# Patient Record
Sex: Male | Born: 1995
Health system: Southern US, Community
[De-identification: ages and names within clinical notes are randomized; demographics above are authoritative.]

## PROBLEM LIST (undated history)

## (undated) DIAGNOSIS — J45909 Unspecified asthma, uncomplicated: Secondary | ICD-10-CM

## (undated) HISTORY — PX: ANTERIOR CRUCIATE LIGAMENT REPAIR: SHX115

## (undated) HISTORY — PX: MENISCUS REPAIR: SHX5179

## (undated) HISTORY — DX: Unspecified asthma, uncomplicated: J45.909

---

## 2017-04-24 ENCOUNTER — Encounter: Payer: Self-pay | Admitting: Family Medicine

## 2017-04-24 ENCOUNTER — Ambulatory Visit (INDEPENDENT_AMBULATORY_CARE_PROVIDER_SITE_OTHER): Payer: BLUE CROSS/BLUE SHIELD | Admitting: Family Medicine

## 2017-04-24 VITALS — BP 124/67 | HR 77 | Temp 98.7°F | Resp 14

## 2017-04-24 DIAGNOSIS — J069 Acute upper respiratory infection, unspecified: Secondary | ICD-10-CM

## 2017-04-24 MED ORDER — AZITHROMYCIN 250 MG PO TABS: ORAL_TABLET | ORAL | 0 refills | Status: DC

## 2017-04-24 NOTE — Progress Notes (Signed)
Patient presents with nasal congestion and cough for one week. Admits to worsening night cough and colored mucus. Has started to take Claritin. Denies hx of asthma or sinus problems. Denies CP, SOB, fever, severe headache, N/V/D.  ROS: Negative except mentioned above. Vitals as per Epic. GENERAL: NAD HEENT: mild pharyngeal erythema, no exudate, no erythema of TMs, no cervical LAD RESP: CTA B CARD: RRR NEURO: CN II-XII grossly intact   A/P: URI - Z-pk, Nyquil at night for a few nights, continue Claritin, rest, hydration, seek medical attention if symptoms persist or worsen. No athletic activity or class if febrile.

## 2017-07-09 ENCOUNTER — Other Ambulatory Visit: Payer: Self-pay | Admitting: Family Medicine

## 2017-07-09 DIAGNOSIS — R05 Cough: Secondary | ICD-10-CM

## 2017-07-09 DIAGNOSIS — R059 Cough, unspecified: Secondary | ICD-10-CM

## 2017-07-10 ENCOUNTER — Ambulatory Visit
Admission: RE | Admit: 2017-07-10 | Discharge: 2017-07-10 | Disposition: A | Discharge status: Home / Self Care | Payer: BLUE CROSS/BLUE SHIELD | Source: Ambulatory Visit | Attending: Family Medicine | Admitting: Family Medicine

## 2017-07-10 DIAGNOSIS — R05 Cough: Secondary | ICD-10-CM | POA: Diagnosis present

## 2017-07-10 DIAGNOSIS — R059 Cough, unspecified: Secondary | ICD-10-CM

## 2017-07-16 ENCOUNTER — Encounter: Payer: Self-pay | Admitting: Family Medicine

## 2017-07-16 ENCOUNTER — Ambulatory Visit (INDEPENDENT_AMBULATORY_CARE_PROVIDER_SITE_OTHER): Payer: PRIVATE HEALTH INSURANCE | Admitting: Family Medicine

## 2017-07-16 VITALS — BP 136/54 | HR 65 | Temp 97.3°F | Resp 14

## 2017-07-16 DIAGNOSIS — R059 Cough, unspecified: Secondary | ICD-10-CM

## 2017-07-16 DIAGNOSIS — R05 Cough: Secondary | ICD-10-CM

## 2017-07-16 NOTE — Progress Notes (Signed)
Patient presents today with symptoms of persistent cough. Patient states that he has had a cough since November. He denies any known allergens. He denies any history of asthma. He states that it is a mucousy cough and usually is worse in the mornings and at night. He occasionally coughs at night and does take a cough suppressant. He was treated with a Z-Pak back in November when I saw him. His cough persisted so he went to a doctor at home who gave him another Z-Pak prescription along with an Albuterol Inhaler, Flonase and cough medication. When he returned back to school his cough was still persistent so a chest x-ray was ordered which was read as normal. Patient denies any fever, chills or weight loss. He denies any chest pain, shortness of breath, wheezing. His symptoms are not affecting him playing baseball.  ROS: Negative except mentioned above.  GENERAL: NAD HEENT: mild pharyngeal erythema, no exudate, no erythema of TMs, no cervical LAD RESP: CTA B CARD: RRR NEURO: CN II-XII grossly intact   A/P: Persistent cough - appears to be more allergy related, advised patient to take Flonase daily and also an oral antihistamine such as Claritin or Zyrtec. I have advised him to do this for 7-10 days to see if it makes a difference with his symptoms. I have also ordered a CBC and a BMP. If symptoms do persist would recommend pulmonary referral and/or allergy testing. Patient addresses understanding and will seek medical attention if any acute problems.

## 2017-07-17 LAB — CBC WITH DIFFERENTIAL/PLATELET
Basophils Absolute: 0 10*3/uL (ref 0.0–0.2)
Basos: 0 %
EOS (ABSOLUTE): 0.1 10*3/uL (ref 0.0–0.4)
Eos: 1 %
Hematocrit: 47.3 % (ref 37.5–51.0)
Hemoglobin: 16 g/dL (ref 13.0–17.7)
Immature Grans (Abs): 0 10*3/uL (ref 0.0–0.1)
Immature Granulocytes: 0 %
Lymphocytes Absolute: 1.4 10*3/uL (ref 0.7–3.1)
Lymphs: 17 %
MCH: 29.5 pg (ref 26.6–33.0)
MCHC: 33.8 g/dL (ref 31.5–35.7)
MCV: 87 fL (ref 79–97)
Monocytes Absolute: 0.4 10*3/uL (ref 0.1–0.9)
Monocytes: 5 %
Neutrophils Absolute: 6.2 10*3/uL (ref 1.4–7.0)
Neutrophils: 77 %
Platelets: 267 10*3/uL (ref 150–379)
RBC: 5.42 x10E6/uL (ref 4.14–5.80)
RDW: 13.8 % (ref 12.3–15.4)
WBC: 8 10*3/uL (ref 3.4–10.8)

## 2017-07-17 LAB — BASIC METABOLIC PANEL
BUN/Creatinine Ratio: 11 (ref 9–20)
BUN: 14 mg/dL (ref 6–20)
CO2: 22 mmol/L (ref 20–29)
Calcium: 9.7 mg/dL (ref 8.7–10.2)
Chloride: 103 mmol/L (ref 96–106)
Creatinine, Ser: 1.23 mg/dL (ref 0.76–1.27)
GFR calc Af Amer: 97 mL/min/{1.73_m2} (ref >59)
GFR calc non Af Amer: 84 mL/min/{1.73_m2} (ref >59)
Glucose: 91 mg/dL (ref 65–99)
Potassium: 4.7 mmol/L (ref 3.5–5.2)
Sodium: 141 mmol/L (ref 134–144)

## 2017-07-30 ENCOUNTER — Other Ambulatory Visit: Payer: Self-pay | Admitting: Family Medicine

## 2017-07-30 ENCOUNTER — Institutional Professional Consult (permissible substitution): Payer: PRIVATE HEALTH INSURANCE | Admitting: Internal Medicine

## 2017-07-30 DIAGNOSIS — R059 Cough, unspecified: Secondary | ICD-10-CM

## 2017-07-30 DIAGNOSIS — R05 Cough: Secondary | ICD-10-CM

## 2017-07-31 ENCOUNTER — Institutional Professional Consult (permissible substitution): Payer: PRIVATE HEALTH INSURANCE | Admitting: Internal Medicine

## 2017-07-31 ENCOUNTER — Other Ambulatory Visit: Payer: Self-pay | Admitting: Family Medicine

## 2017-07-31 DIAGNOSIS — R05 Cough: Secondary | ICD-10-CM

## 2017-07-31 DIAGNOSIS — R059 Cough, unspecified: Secondary | ICD-10-CM

## 2017-08-11 ENCOUNTER — Ambulatory Visit (INDEPENDENT_AMBULATORY_CARE_PROVIDER_SITE_OTHER): Payer: BLUE CROSS/BLUE SHIELD | Admitting: Internal Medicine

## 2017-08-11 ENCOUNTER — Encounter: Payer: Self-pay | Admitting: Internal Medicine

## 2017-08-11 VITALS — BP 136/86 | HR 58 | Ht 69.0 in | Wt 173.0 lb

## 2017-08-11 DIAGNOSIS — J40 Bronchitis, not specified as acute or chronic: Secondary | ICD-10-CM

## 2017-08-11 MED ORDER — FLUTICASONE PROPIONATE HFA 220 MCG/ACT IN AERO: 2.0000 | INHALATION_SPRAY | Freq: Two times a day (BID) | RESPIRATORY_TRACT | 2 refills | Status: AC

## 2017-08-11 NOTE — Progress Notes (Signed)
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 chronic cough His cough started 4 months ago after he had a upper respiratory tract infection and since then and intermittently he has been having chronic productive cough with green tinged sputum along with chest congestion Patient also noted to have postnasal drainage Patient has been prescribed Flonase and antihistamine with Allegra he says that it has helped a little Patient denies wheezing but does have intermittent cough with intermittent exercise But overall does not really affect his exercise Patient is a baseball player outfielder Patient was given 2 rounds of antibiotics with Z-Pak and did not help his  chronic productive cough Patient was also given albuterol inhaler as needed and it does not seem to help as much Patient denies any fevers or chills at this time No signs of infection at this time  PAST MEDICAL HISTORY :  ALLERGHIC RHINITIS  SURGICAL HISTORY  Patient has no prior history of surgeries No trauma Denies surgical procedures   Prior to Admission medications   Medication Sig Start Date End Date Taking? Authorizing Provider  azithromycin (ZITHROMAX Z-PAK) 250 MG tablet Use as directed on box for 5 days. Patient not taking: Reported on 07/16/2017 04/24/17   Jolene Provost, MD  loratadine (CLARITIN) 10 MG tablet Take 10 mg by mouth daily.    [provider]   No Known Allergies  FAMILY HISTORY:  +HTN  SOCIAL HISTORY:  reports that  has never smoked. he has never used smokeless tobacco. He reports that he does not drink alcohol.  REVIEW OF SYSTEMS:     Constitutional: Negative for fever, chills, weight loss, malaise/fatigue and diaphoresis.  HENT: Negative for hearing loss, ear pain, nosebleeds, congestion, sore throat, neck pain, tinnitus and ear discharge.   Eyes: Negative for blurred vision, double vision, photophobia, pain, discharge and redness.  Respiratory: + cough,- hemoptysis, +sputum production, -shortness of breath, -wheezing and -stridor.   Cardiovascular: Negative for chest pain, palpitations, orthopnea, claudication, leg swelling and PND.  Gastrointestinal: Negative for heartburn, nausea, vomiting, abdominal pain, diarrhea, constipation, blood in stool and melena.  Genitourinary: Negative for dysuria, urgency, frequency, hematuria and flank pain.  Musculoskeletal: Negative for myalgias, back pain, joint pain and falls.  Skin: Negative for itching and rash.  Neurological: Negative for dizziness, tingling, tremors, sensory change, speech change, focal weakness, seizures, loss of consciousness, weakness and headaches.  Endo/Heme/Allergies: Negative for environmental allergies and polydipsia. Does not bruise/bleed easily.  ALL OTHER ROS ARE NEGATIVE BP 136/86 (BP Location: Left Arm, Cuff Size: Normal)   Pulse (!) 58   Ht 5\' 9"  (1.753 m)   Wt 173 lb (78.5 kg)   SpO2 99%   BMI 25.55 kg/m    Physical Examination:   GENERAL:NAD, no fevers, chills, no weakness no fatigue HEAD: Normocephalic, atraumatic.  EYES: Pupils equal, round, reactive to light. Extraocular muscles intact. No scleral icterus.  MOUTH: Moist mucosal membrane.   EAR, NOSE, THROAT: Clear without exudates. No external lesions.  NECK: Supple. No thyromegaly. No nodules. No JVD.  PULMONARY:CTA B/L no wheezes, no crackles, no rhonchi CARDIOVASCULAR: S1 and S2. Regular rate and rhythm. No murmurs, rubs, or  gallops. No edema.  GASTROINTESTINAL: Soft, nontender, nondistended. No masses. Positive bowel sounds.  MUSCULOSKELETAL: No swelling, clubbing, or edema. Range  of motion full in all extremities.  NEUROLOGIC: Cranial nerves II through XII are intact. No gross focal neurological deficits.  SKIN: No ulceration, lesions, rashes, or cyanosis. Skin warm and dry. Turgor intact.  PSYCHIATRIC: Mood, affect within normal limits. The patient is awake, alert and oriented x 3. Insight, judgment intact.      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  At this time patient may have inflammatory bronchitis such as eosinophilic bronchitis  I would recommend treating with inhaled steroids with Flovent to 20 mcg 2 puffs twice daily We will reassess in 4 weeks to assess his chronic productive cough.  Patient  satisfied with Plan of action and management. All questions answered  Lucie LeatherKurian David Alyjah Lovingood, M.D.  [facility]  Medical Director [facility] Medical Director [facility]

## 2017-08-11 NOTE — Patient Instructions (Signed)
START FLOVENT

## 2017-08-12 ENCOUNTER — Telehealth: Payer: Self-pay | Admitting: Internal Medicine

## 2017-08-12 NOTE — Telephone Encounter (Signed)
Lance CockayneLinda Archer (pt mother) calling stating pt was prescribed Flovent  It is costing them $298 Would like to know if there is something else he can take of this   Please call back

## 2017-08-13 ENCOUNTER — Telehealth: Payer: Self-pay | Admitting: Internal Medicine

## 2017-08-13 MED ORDER — MOMETASONE FUROATE 220 MCG/INH IN AEPB: 1.0000 | INHALATION_SPRAY | Freq: Two times a day (BID) | RESPIRATORY_TRACT | 2 refills | Status: AC

## 2017-08-13 NOTE — Telephone Encounter (Signed)
Please have patient insurance company and obtain copy of your medication formulary   This will help your Pulmonologist to prescribe the most cost effective medications that your insurance company allows.

## 2017-08-13 NOTE — Telephone Encounter (Signed)
Spoke with patient's mother and advised as stated below. She will contact her insurance and get back with our office. Nothing further needed.

## 2017-08-13 NOTE — Telephone Encounter (Signed)
Spoke with Dr. Lionel DecemberKasa Asmanex has been sent to pharmacy. We will await call back to see if able to afford.

## 2017-08-13 NOTE — Telephone Encounter (Signed)
Patients mother aware  

## 2017-08-13 NOTE — Telephone Encounter (Signed)
Pt mother is calling states for Flovent is Tier 4. Tier 1 is coverered at 100%. 1-3 is 20% out of pocket.

## 2017-08-13 NOTE — Telephone Encounter (Signed)
Patient's mother will call back with medication on Tier 1 list.

## 2017-09-07 ENCOUNTER — Ambulatory Visit
Admission: RE | Admit: 2017-09-07 | Discharge: 2017-09-07 | Disposition: A | Discharge status: Home / Self Care | Payer: BLUE CROSS/BLUE SHIELD | Source: Ambulatory Visit | Attending: Family Medicine | Admitting: Family Medicine

## 2017-09-07 ENCOUNTER — Other Ambulatory Visit: Payer: Self-pay | Admitting: Family Medicine

## 2017-09-07 DIAGNOSIS — R52 Pain, unspecified: Secondary | ICD-10-CM

## 2017-09-07 DIAGNOSIS — R0781 Pleurodynia: Secondary | ICD-10-CM | POA: Diagnosis not present

## 2017-09-08 ENCOUNTER — Encounter: Payer: Self-pay | Admitting: Internal Medicine

## 2017-09-08 ENCOUNTER — Other Ambulatory Visit
Admission: RE | Admit: 2017-09-08 | Discharge: 2017-09-08 | Disposition: A | Discharge status: Home / Self Care | Payer: BLUE CROSS/BLUE SHIELD | Source: Ambulatory Visit | Attending: Internal Medicine | Admitting: Internal Medicine

## 2017-09-08 ENCOUNTER — Ambulatory Visit (INDEPENDENT_AMBULATORY_CARE_PROVIDER_SITE_OTHER): Payer: BLUE CROSS/BLUE SHIELD | Admitting: Internal Medicine

## 2017-09-08 VITALS — BP 126/80 | HR 60 | Ht 69.0 in | Wt 175.0 lb

## 2017-09-08 DIAGNOSIS — J4520 Mild intermittent asthma, uncomplicated: Secondary | ICD-10-CM | POA: Diagnosis not present

## 2017-09-08 DIAGNOSIS — R0602 Shortness of breath: Secondary | ICD-10-CM

## 2017-09-08 NOTE — Patient Instructions (Addendum)
AVOID EXPOSURE TO MOLD STOP INHALERS AT THIS TIME  FUNGAL CULTURES FUNGAL SEROLOGY SPUTUM CULTURE  Obtain CT CHEST

## 2017-09-08 NOTE — Progress Notes (Signed)
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  having chronic productive cough with green tinged sputum along with chest congestion I started Flovent/Asthmanex and his symptoms still persisit Patient also noted to have postnasal drainage Patient has been prescribed Flonase and antihistamine with Allegra he says that it has helped a little Patient denies wheezing But overall his symptoms do NOT really affect his exercise Patient is a baseball player outfielder  Patient denies any fevers or chills at this time No signs of infection at this time   Mom at OV today-she has shared her pics with me about his living conditions-there is MOLD in his APT that he is exposed to  PAST MEDICAL HISTORY :  ALLERGHIC RHINITIS  SURGICAL HISTORY  Patient has no prior history of surgeries No trauma Denies surgical procedures  REVIEW OF SYSTEMS:   Constitutional: Negative for fever, chills, weight loss, malaise/fatigue and diaphoresis.  HENT: Negative for hearing loss, ear pain, nosebleeds, congestion, sore throat, neck pain, tinnitus and ear discharge.   Eyes: Negative for blurred vision, double vision, photophobia, pain, discharge and redness.  Respiratory: + cough,- hemoptysis, +sputum production, -shortness of breath, -wheezing and -stridor.   Cardiovascular: Negative for chest pain, palpitations, orthopnea, claudication, leg swelling and PND.  ALL OTHER ROS ARE NEGATIVE  BP 126/80 (BP Location: Left Arm, Cuff Size: Normal)   Pulse 60   Ht 5\' 9"  (1.753 m)   Wt 175 lb (79.4 kg)   SpO2 98%   BMI 25.84 kg/m    Physical Examination:    GENERAL:NAD, no fevers, chills, no weakness no fatigue HEAD: Normocephalic, atraumatic.  EYES: Pupils equal, round, reactive to light. Extraocular muscles intact. No scleral icterus.  MOUTH: Moist mucosal membrane.   EAR, NOSE, THROAT: Clear without exudates. No external lesions.  NECK: Supple. No thyromegaly. No nodules. No JVD.  PULMONARY:CTA B/L no wheezes, no crackles, no rhonchi CARDIOVASCULAR: S1 and S2. Regular rate and rhythm. No murmurs, rubs, or gallops. No edema.  GASTROINTESTINAL: Soft, nontender, nondistended. No masses. Positive bowel sounds.  MUSCULOSKELETAL: No swelling, clubbing, or edema. Range of motion full in all extremities.  NEUROLOGIC: Cranial nerves II through XII are intact. No gross focal neurological deficits.  SKIN: No ulceration, lesions, rashes, or cyanosis. Skin warm and dry. Turgor intact.  PSYCHIATRIC: Mood, affect within normal limits. The patient is awake, alert and oriented x 3. Insight, judgment intact.      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  At this time patient may have reactive airways disease from MOLD exposure  I recommend checking Fungal and SPutum cultures Check Fungal serology Check CT chest for adenopathy/pneumonia  Stop inhalers at this time as they do NOT seem to be helping I have advised patient to move out of APT ASAP  Will follow up serology and CT chest and come up with plan of action once test results are completed   Patient/Family satisfied with Plan of  action and management. All questions answered  Lucie LeatherKurian David Kincade Granberg, M.D.  [facility]  Medical Director [facility] Medical Director [facility]

## 2017-09-11 LAB — FUNGAL ANTIBODIES PANEL, ID-BLOOD
Aspergillus flavus: NEGATIVE
Aspergillus fumigatus, IgG: NEGATIVE
Aspergillus niger: NEGATIVE
Blastomyces Abs, Qn, DID: NEGATIVE
Histoplasma Ab, Immunodiffusion: NEGATIVE

## 2017-09-15 ENCOUNTER — Other Ambulatory Visit
Admission: RE | Admit: 2017-09-15 | Discharge: 2017-09-15 | Disposition: A | Discharge status: Home / Self Care | Payer: BLUE CROSS/BLUE SHIELD | Source: Ambulatory Visit | Attending: Internal Medicine | Admitting: Internal Medicine

## 2017-09-15 DIAGNOSIS — J4520 Mild intermittent asthma, uncomplicated: Secondary | ICD-10-CM | POA: Insufficient documentation

## 2017-09-15 LAB — EXPECTORATED SPUTUM ASSESSMENT W GRAM STAIN, RFLX TO RESP C

## 2017-09-16 ENCOUNTER — Telehealth: Payer: Self-pay | Admitting: Internal Medicine

## 2017-09-16 NOTE — Telephone Encounter (Signed)
Patient family member calling to check on status of lab results Please call to discuss

## 2017-09-17 ENCOUNTER — Ambulatory Visit
Admission: RE | Admit: 2017-09-17 | Discharge: 2017-09-17 | Disposition: A | Discharge status: Home / Self Care | Payer: BLUE CROSS/BLUE SHIELD | Source: Ambulatory Visit | Attending: Internal Medicine | Admitting: Internal Medicine

## 2017-09-17 DIAGNOSIS — R0602 Shortness of breath: Secondary | ICD-10-CM | POA: Insufficient documentation

## 2017-09-17 MED ORDER — IOPAMIDOL (ISOVUE-300) INJECTION 61%
75.0000 mL | Freq: Once | INTRAVENOUS | Status: AC | PRN
  Administered 2017-09-17: 75 mL via INTRAVENOUS

## 2017-09-17 NOTE — Telephone Encounter (Signed)
I Called and Left VM for patient  All results of negative up to this point

## 2017-09-18 LAB — CULTURE, RESPIRATORY W GRAM STAIN: Culture: NORMAL

## 2017-09-18 NOTE — Telephone Encounter (Signed)
Called patient, unable to reach left message to give us a call back regarding results.  

## 2017-09-18 NOTE — Telephone Encounter (Signed)
Patient's mother aware all results negative per Dr. Belia HemanKasa.

## 2017-09-18 NOTE — Telephone Encounter (Signed)
Patient mother Lance Archer calling  Would like to know status of CAT Scan results Patient will be unavailable after 1pm Please call to discuss

## 2017-09-21 ENCOUNTER — Telehealth: Payer: Self-pay | Admitting: Internal Medicine

## 2017-09-21 NOTE — Telephone Encounter (Signed)
Pt aware all results normal Ct and labs. He will keep f/u scheduled for 09/29/17.

## 2017-09-28 ENCOUNTER — Ambulatory Visit (INDEPENDENT_AMBULATORY_CARE_PROVIDER_SITE_OTHER): Payer: BLUE CROSS/BLUE SHIELD | Admitting: Internal Medicine

## 2017-09-28 ENCOUNTER — Encounter: Payer: Self-pay | Admitting: Internal Medicine

## 2017-09-28 VITALS — BP 122/74 | HR 62 | Resp 16 | Ht 69.0 in | Wt 171.0 lb

## 2017-09-28 DIAGNOSIS — S83519A Sprain of anterior cruciate ligament of unspecified knee, initial encounter: Secondary | ICD-10-CM | POA: Insufficient documentation

## 2017-09-28 DIAGNOSIS — J309 Allergic rhinitis, unspecified: Secondary | ICD-10-CM | POA: Diagnosis not present

## 2017-09-28 DIAGNOSIS — M25569 Pain in unspecified knee: Secondary | ICD-10-CM | POA: Insufficient documentation

## 2017-09-28 NOTE — Progress Notes (Signed)
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 His cough is better, still productive in AM, but does NOT bother him as much at night Patient has signs and symptoms of allergic rhinitis  Patient also noted to have postnasal drainage  Patient denies wheezing But overall his symptoms do NOT really affect his exercise Patient is a baseball player outfielder  Patient denies any fevers or chills at this time No signs of infection at this time    PAST MEDICAL HISTORY :  ALLERGHIC RHINITIS  SURGICAL HISTORY  Patient has no prior history of surgeries No trauma Denies surgical procedures  REVIEW OF SYSTEMS:   Constitutional: Negative for fever, chills, weight loss, malaise/fatigue and diaphoresis.  HENT: Negative for hearing loss, ear pain, nosebleeds, congestion, sore throat, neck pain, tinnitus and ear discharge.   Eyes: Negative for blurred vision, double vision, photophobia, pain, discharge and redness.  Respiratory: + cough,- hemoptysis, +sputum production, -shortness of breath, -wheezing and -stridor.   Cardiovascular: Negative for chest pain, palpitations, orthopnea, claudication, leg swelling and PND.  ALL OTHER ROS ARE NEGATIVE  BP 122/74 (BP Location: Left Arm, Cuff Size: Normal)   Pulse 62   Resp 16   Ht 5\' 9"  (1.753 m)   Wt 171 lb (77.6 kg)   SpO2 98%   BMI 25.25 kg/m    Physical Examination:   GENERAL:NAD, no fevers, chills, no weakness no fatigue HEAD: Normocephalic, atraumatic.  EYES: Pupils equal, round, reactive to  light. Extraocular muscles intact. No scleral icterus.  MOUTH: Moist mucosal membrane.   EAR, NOSE, THROAT: Clear without exudates. No external lesions.  NECK: Supple. No thyromegaly. No nodules. No JVD.  PULMONARY:CTA B/L no wheezes, no crackles, no rhonchi CARDIOVASCULAR: S1 and S2. Regular rate and rhythm. No murmurs, rubs, or gallops. No edema.  GASTROINTESTINAL: Soft, nontender, nondistended. No masses. Positive bowel sounds.  MUSCULOSKELETAL: No swelling, clubbing, or edema. Range of motion full in all extremities.  NEUROLOGIC: Cranial nerves II through XII are intact. No gross focal neurological deficits.  SKIN: No ulceration, lesions, rashes, or cyanosis. Skin warm and dry. Turgor intact.  PSYCHIATRIC: Mood, affect within normal limits. The patient is awake, alert and oriented x 3. Insight, judgment intact.       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  At this time patient may have reactive airways disease from MOLD exposure His symptoms have improved [place] [facility] [place] [facility] [place] [facility] [place], patient has signs and symptoms of allergic rhinitis  ALL serologies and cultures are not growing anything significant  Stop inhalers at this time as they do NOT seem to be helping Patient has been instructed to take Flonase and Claritan daily    Patient/Family satisfied with Plan of action and management. All questions answered Mom was updated over the phone   Lucie LeatherKurian David Layla Gramm, M.D.  [facility]  Medical Director [place] Director [facility] Cardio-Pulmonary Department

## 2017-09-28 NOTE — Patient Instructions (Signed)
START FLONASE AND CLARITAN DAILY

## 2017-09-29 ENCOUNTER — Ambulatory Visit: Payer: PRIVATE HEALTH INSURANCE | Admitting: Internal Medicine

## 2017-10-06 LAB — CULTURE, FUNGUS WITHOUT SMEAR

## 2018-03-12 ENCOUNTER — Ambulatory Visit (INDEPENDENT_AMBULATORY_CARE_PROVIDER_SITE_OTHER): Payer: PRIVATE HEALTH INSURANCE | Admitting: Family Medicine

## 2018-03-12 ENCOUNTER — Ambulatory Visit
Admission: RE | Admit: 2018-03-12 | Discharge: 2018-03-12 | Disposition: A | Discharge status: Home / Self Care | Payer: BLUE CROSS/BLUE SHIELD | Source: Ambulatory Visit | Attending: Family Medicine | Admitting: Family Medicine

## 2018-03-12 ENCOUNTER — Encounter: Payer: Self-pay | Admitting: Family Medicine

## 2018-03-12 ENCOUNTER — Other Ambulatory Visit: Payer: Self-pay | Admitting: Family Medicine

## 2018-03-12 VITALS — BP 138/41 | HR 62 | Temp 98.3°F | Resp 13

## 2018-03-12 DIAGNOSIS — J069 Acute upper respiratory infection, unspecified: Secondary | ICD-10-CM

## 2018-03-12 DIAGNOSIS — R52 Pain, unspecified: Secondary | ICD-10-CM

## 2018-03-12 DIAGNOSIS — M79641 Pain in right hand: Secondary | ICD-10-CM | POA: Diagnosis not present

## 2018-03-12 DIAGNOSIS — M25541 Pain in joints of right hand: Secondary | ICD-10-CM | POA: Diagnosis present

## 2018-03-12 NOTE — Progress Notes (Signed)
Patient presents with symptoms of mild cough for the last two days. Denies any SOB, CP, wheezing, fever, severe headache, N/V/D, abdominal pain. Admits to some postnasal drip but no sore throat. Has taken Nyquil at night.   ROS: Negative except mentioned above. Vitals as per Epic.  GENERAL: NAD HEENT: mild  pharyngeal erythema, no exudate, no erythema of TMs, no cervical LAD RESP: CTA B CARD: RRR NEURO: CN II-XII grossly intact   A/P: URI - likely viral, rest, hydration, oral antihistamine for postnasal drip, Delysm for cough, Tylenol/Motrin prn, seek medical attention if symptoms persist or worsen, no athletic activity if febrile.

## 2019-09-25 IMAGING — CT CT CHEST W/ CM
2 of 4 series · 15 of 36 positions shown, 18 images · IV contrast (iopamidol)
Comparison: 09/07/2017 rib films.  07/10/2017 chest radiograph.

CLINICAL DATA: Shortness of breath. Adenopathy. Morning cough since
[REDACTED]. Nonsmoker without history of primary malignancy.

EXAM:
CT CHEST WITH CONTRAST
TECHNIQUE: Multidetector CT imaging of the chest was performed during
intravenous contrast administration.
CONTRAST:  75mL Y2BRVP-SJJ IOPAMIDOL (Y2BRVP-SJJ) INJECTION 61%

[Series 2: axial chest · axial · 0.69mm/px · z∈[-1335,-1037]mm · 12 of 177 slices shown, 15 images]
[im 14/177  mediastinal]
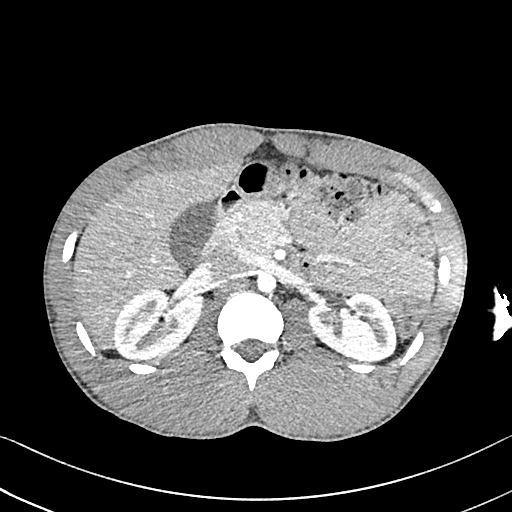
[im 14/177  lung]
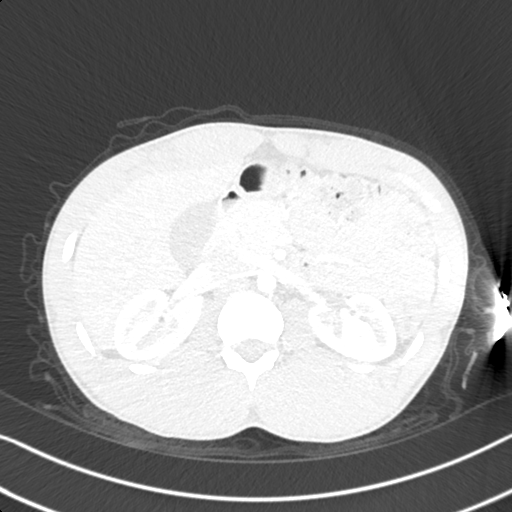
[im 28/177  lung]
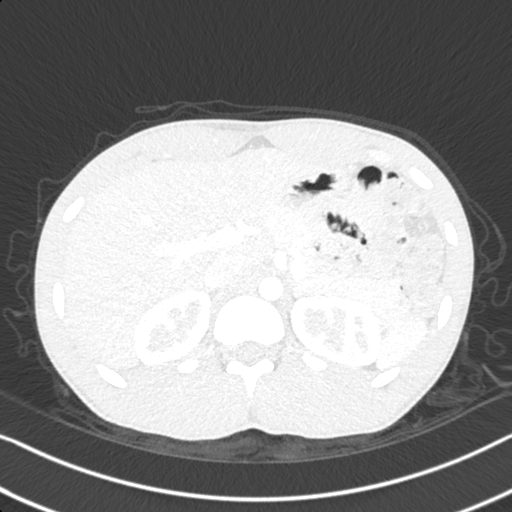
[im 41/177  lung]
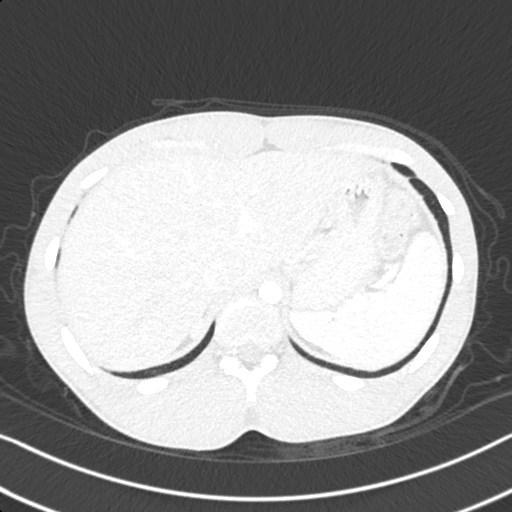
[im 55/177  lung]
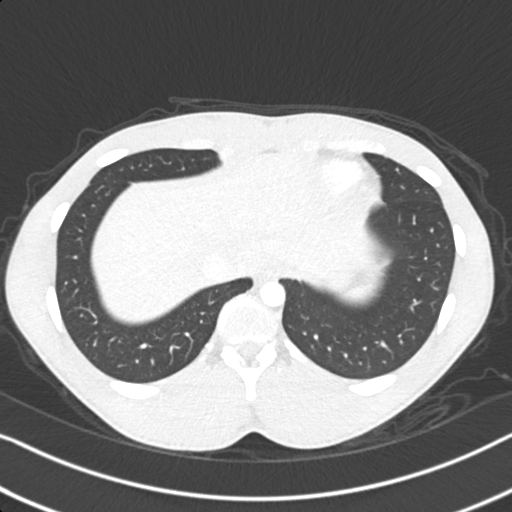
[im 68/177  mediastinal]
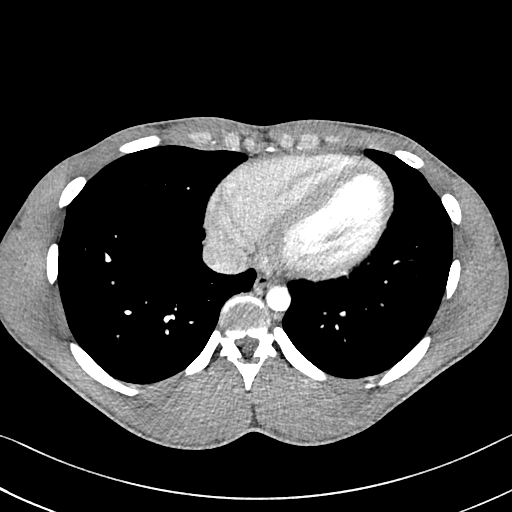
[im 68/177  lung]
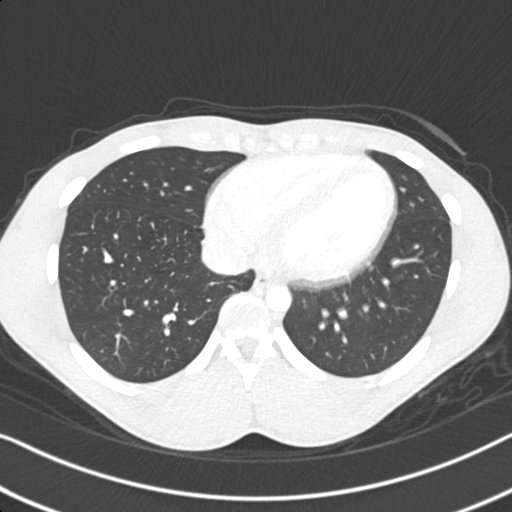
[im 82/177  lung]
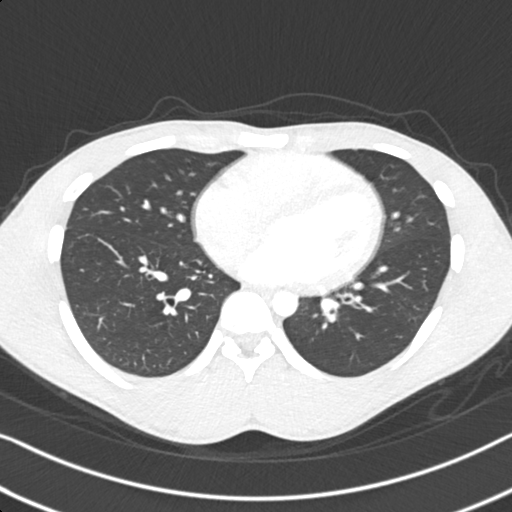
[im 95/177  lung]
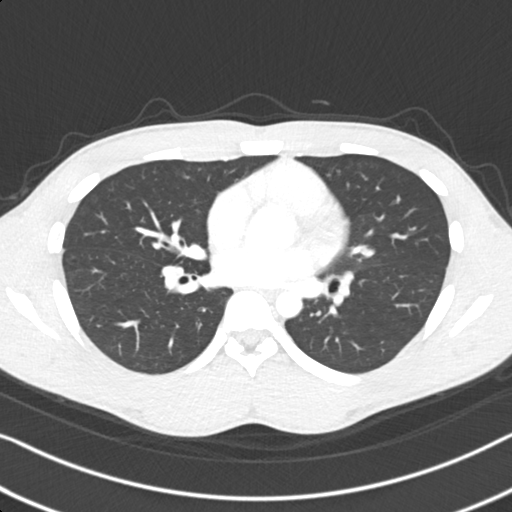
[im 109/177  lung]
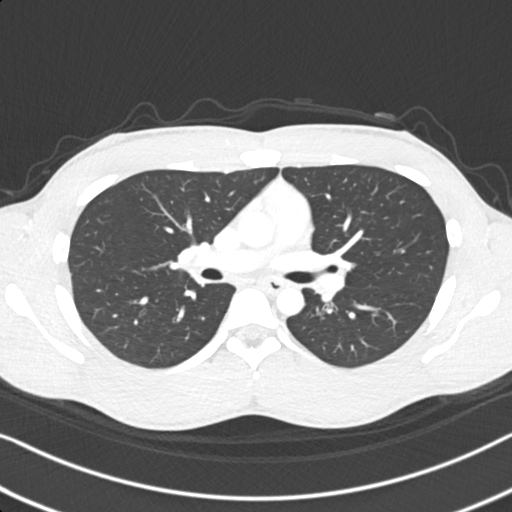
[im 122/177  mediastinal]
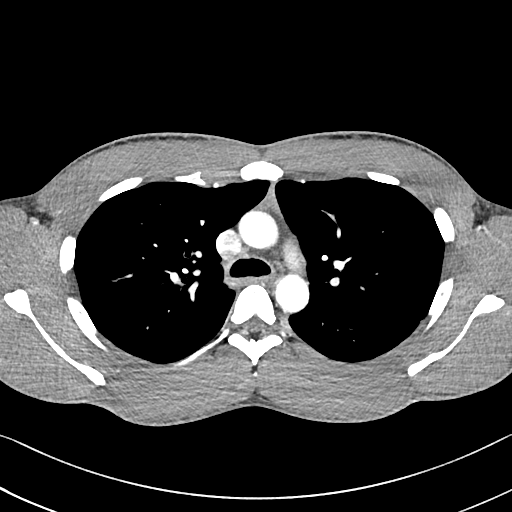
[im 122/177  lung]
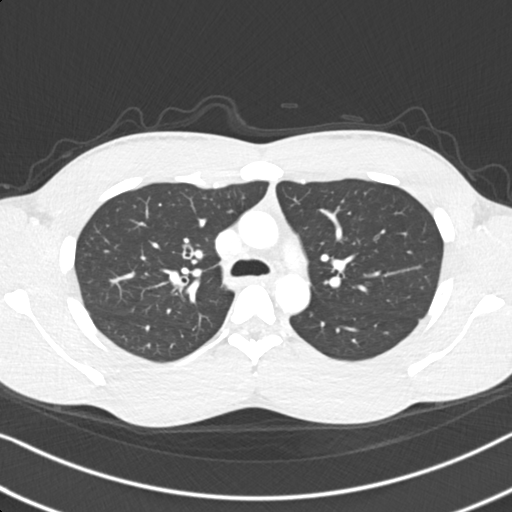
[im 136/177  lung]
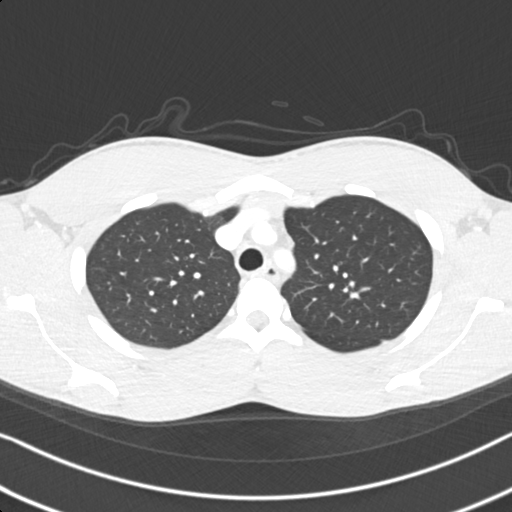
[im 149/177  lung]
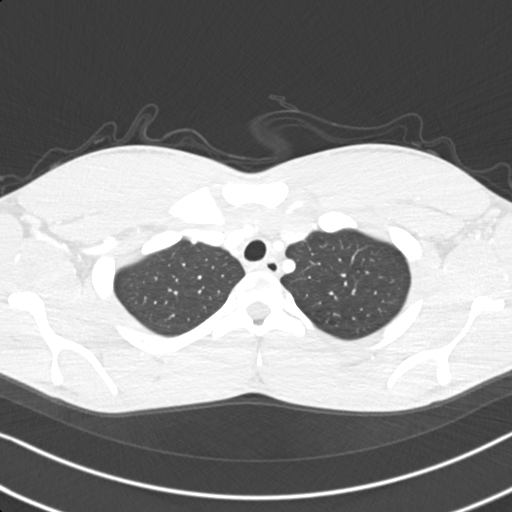
[im 163/177  lung]
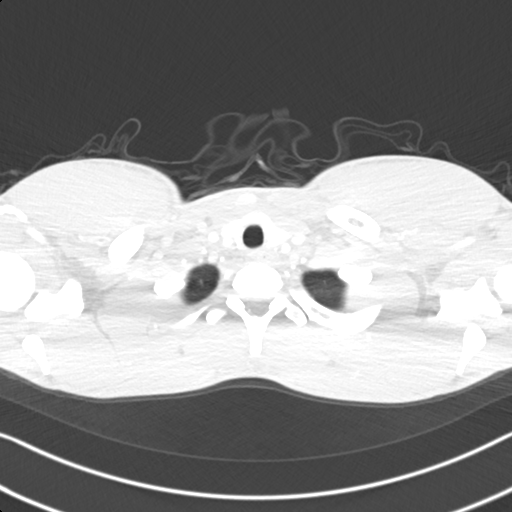

[Series 4: coronal chest · coronal · 0.69mm/px · 3 of 112 slices shown]
[im 23/112  lung]
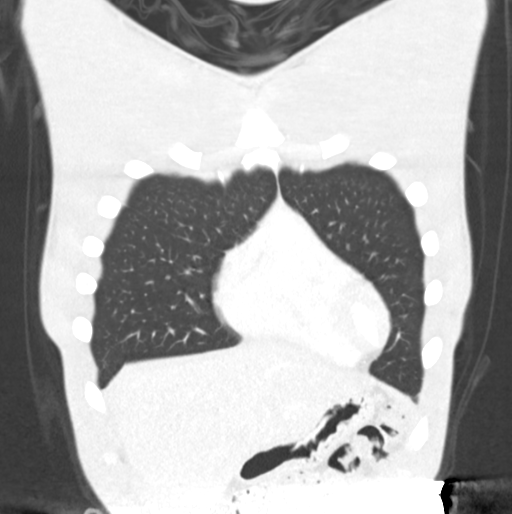
[im 45/112  lung]
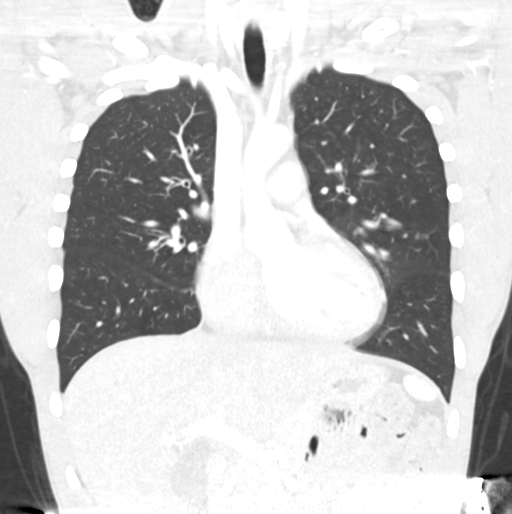
[im 67/112  lung]
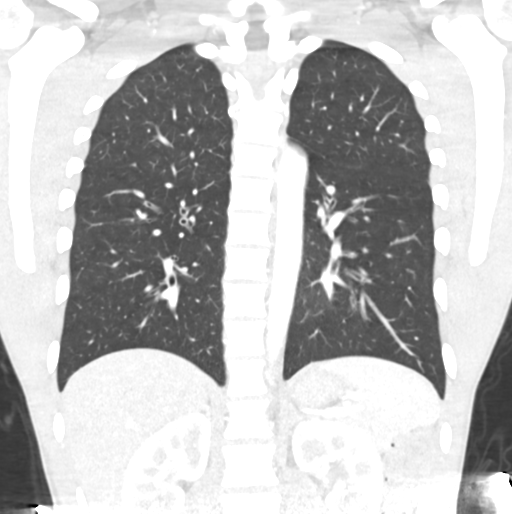

[15 of 36 positions shown; findings below may reference images not displayed]

FINDINGS: Cardiovascular: Bovine arch. Normal heart size, without pericardial
effusion.

Mediastinum/Nodes: No mediastinal or hilar adenopathy. Soft tissue
density in the anterior mediastinum is likely residual thymus.

Lungs/Pleura: No pleural fluid. Minimal motion degradation in the
lower chest.

Patent endobronchial tree.  Clear lungs.

Upper Abdomen: Normal imaged portions of the liver, spleen, stomach,
pancreas, adrenal glands, kidneys.

Musculoskeletal: No acute osseous abnormality.
IMPRESSION: 1. Minimal motion degradation.
2. Given this factor, normal chest CT. No explanation for patient's
symptoms.

## 2020-03-19 IMAGING — CR DG HAND COMPLETE 3+V*R*
1 series · 3 of 3 positions shown · non-contrast
Comparison: None.

CLINICAL DATA: Right hand pain for 2 weeks without known injury.

EXAM:
RIGHT HAND - COMPLETE 3+ VIEW

[Series 1: dg hand complete right · 0.14mm/px · 3 of 3 slices shown]
[im 1/3]
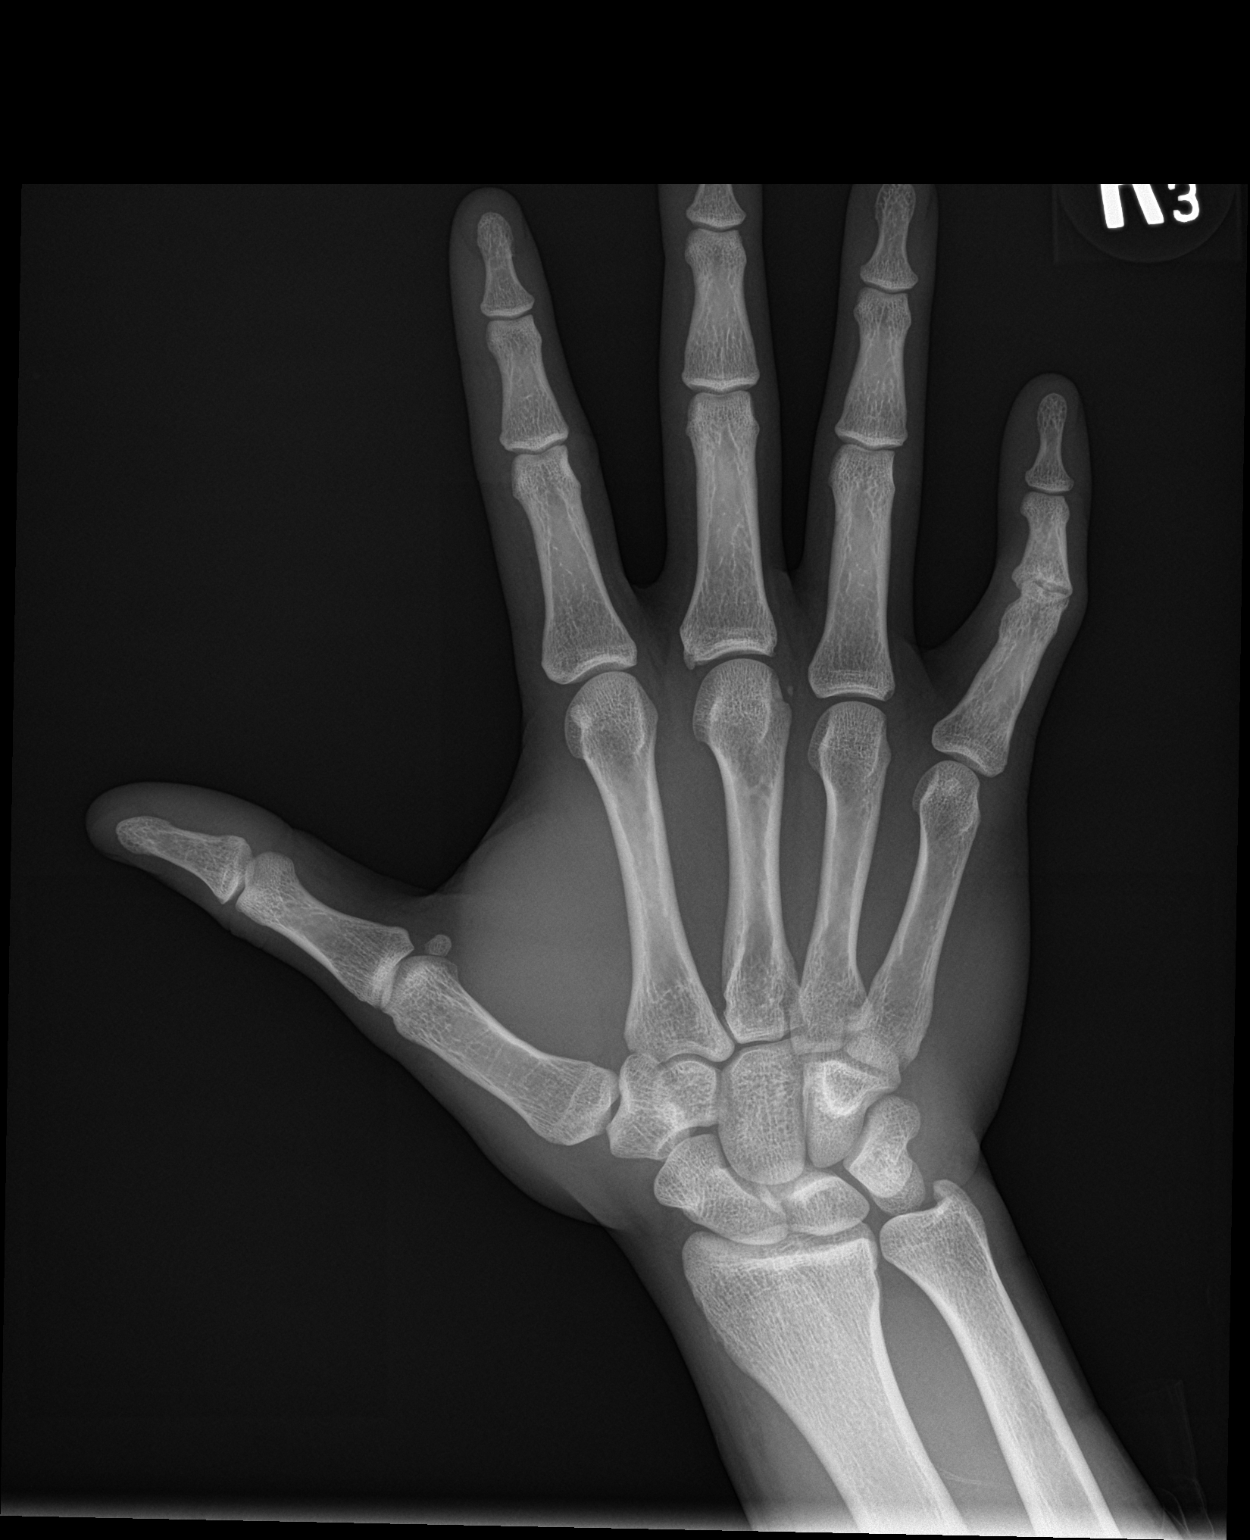
[im 2/3]
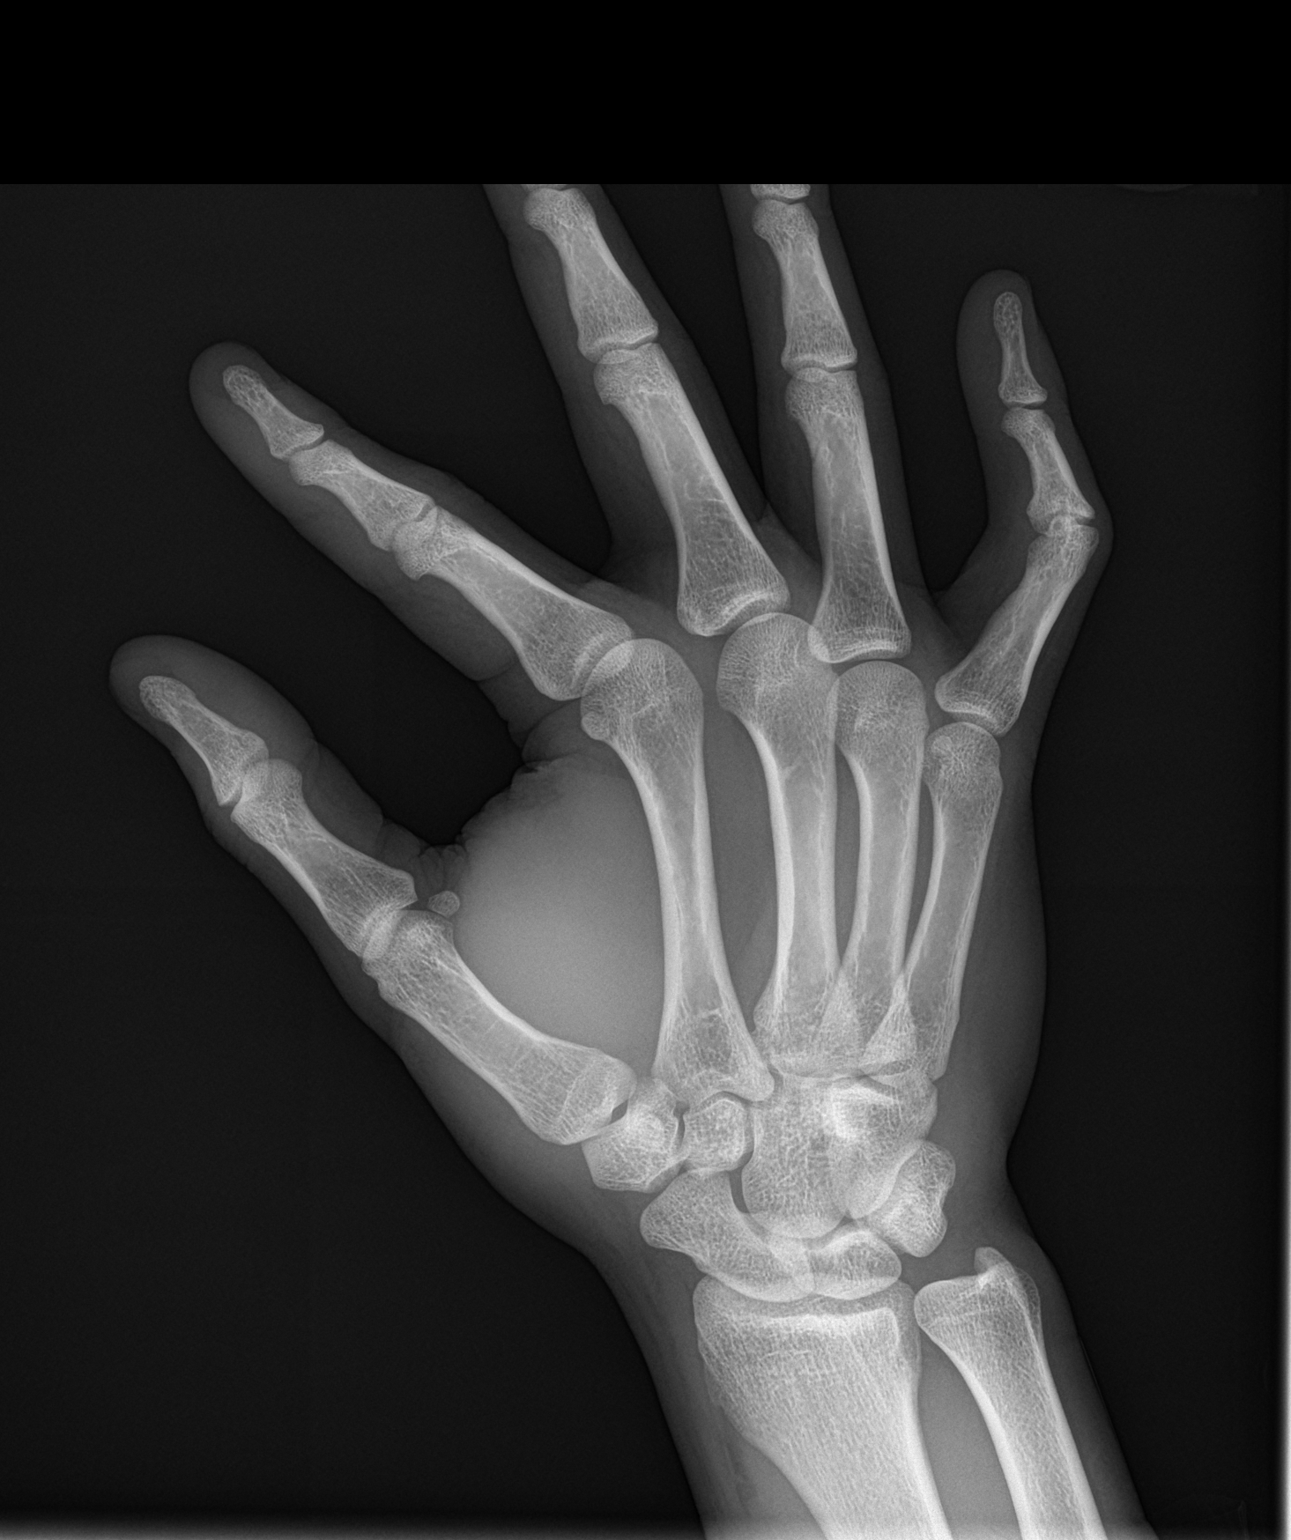
[im 3/3]
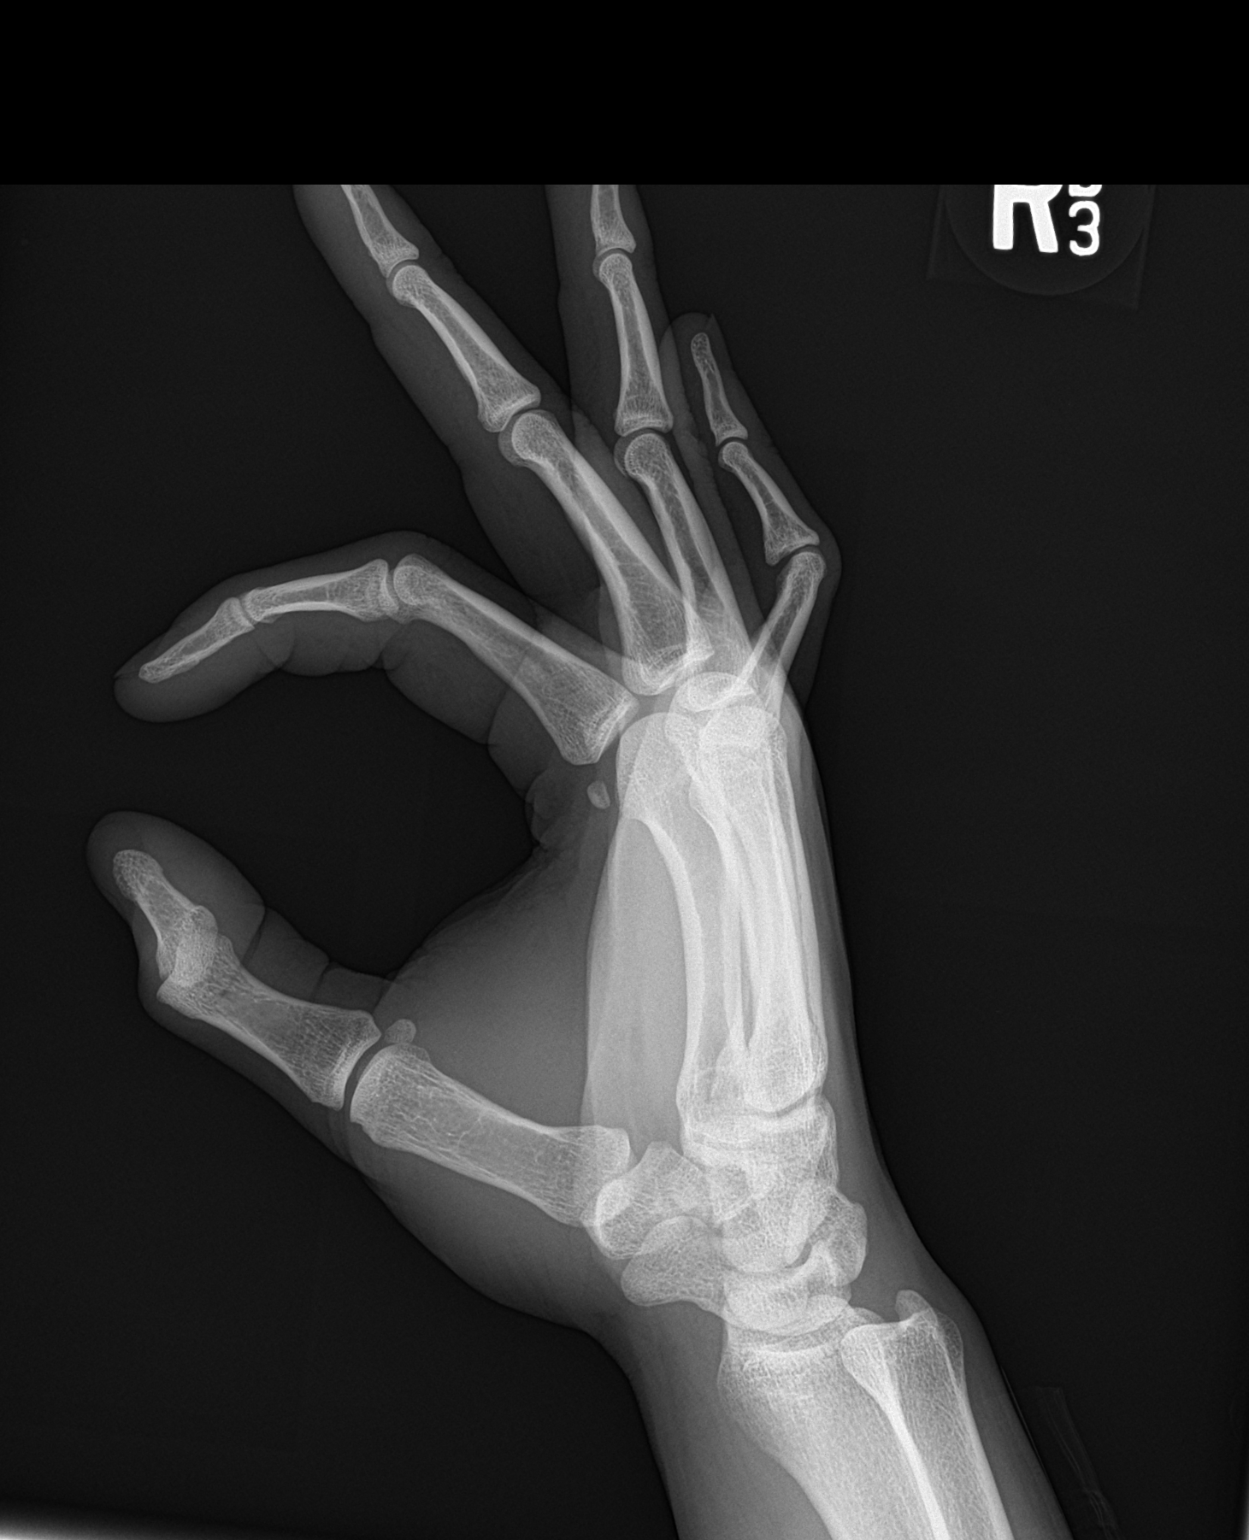

[3 of 3 positions shown; findings below may reference images not displayed]

FINDINGS: There is no evidence of fracture or dislocation. There is no
evidence of arthropathy or other focal bone abnormality. Soft
tissues are unremarkable.
IMPRESSION: No significant abnormality seen in the right hand.
